# Patient Record
Sex: Male | Born: 1960 | Race: White | Hispanic: No | Marital: Single | Smoking: Never smoker
Health system: Southern US, Community
[De-identification: ages and names within clinical notes are randomized; demographics above are authoritative.]

## PROBLEM LIST (undated history)

## (undated) DIAGNOSIS — M2550 Pain in unspecified joint: Secondary | ICD-10-CM

## (undated) DIAGNOSIS — R5383 Other fatigue: Secondary | ICD-10-CM

## (undated) DIAGNOSIS — K4090 Unilateral inguinal hernia, without obstruction or gangrene, not specified as recurrent: Secondary | ICD-10-CM

## (undated) DIAGNOSIS — R5381 Other malaise: Secondary | ICD-10-CM

## (undated) DIAGNOSIS — M549 Dorsalgia, unspecified: Secondary | ICD-10-CM

## (undated) DIAGNOSIS — I10 Essential (primary) hypertension: Secondary | ICD-10-CM

## (undated) HISTORY — DX: Pain in unspecified joint: M25.50

## (undated) HISTORY — DX: Dorsalgia, unspecified: M54.9

## (undated) HISTORY — DX: Other fatigue: R53.83

## (undated) HISTORY — DX: Unilateral inguinal hernia, without obstruction or gangrene, not specified as recurrent: K40.90

## (undated) HISTORY — DX: Other malaise: R53.81

---

## 2014-02-04 ENCOUNTER — Ambulatory Visit (INDEPENDENT_AMBULATORY_CARE_PROVIDER_SITE_OTHER): Payer: Managed Care, Other (non HMO) | Admitting: General Surgery

## 2014-02-04 ENCOUNTER — Encounter (INDEPENDENT_AMBULATORY_CARE_PROVIDER_SITE_OTHER): Payer: Self-pay | Admitting: General Surgery

## 2014-02-04 VITALS — BP 128/80 | HR 71 | Temp 97.6°F | Resp 16 | Ht 70.0 in | Wt 204.2 lb

## 2014-02-04 DIAGNOSIS — K429 Umbilical hernia without obstruction or gangrene: Secondary | ICD-10-CM

## 2014-02-04 DIAGNOSIS — K4090 Unilateral inguinal hernia, without obstruction or gangrene, not specified as recurrent: Secondary | ICD-10-CM

## 2014-02-04 NOTE — Progress Notes (Signed)
Patient ID: Eddie ConstableJames D Sanderford, male   DOB: November 03, 1960, 53 y.o.   MRN: [id number]  No chief complaint on file.   HPI Eddie Frazier is a 53 y.o. male.  The patient is a 53 year old male is referred by Dr. Dossie ArbourW. Harris for evaluation of a right inguinal hernia as well as umbilical hernia. The patient comes today with a 10+ year history of an umbilical hernia which is soft reducible. He also states that approximately a month ago he was moving some boxes at work and felt a back pain as well as right inguinal numbness 2 days after her event. The patient states that over time this felt better however still has pain with sneezing, coughing and Valsalva. The patient comes in today for consultation for possible hernia repair in the future. HPI  Past Medical History  Diagnosis Date  . Inguinal hernia without mention of obstruction or gangrene, unilateral or unspecified, (not specified as recurrent)   . Pain in joint, site unspecified   . Backache, unspecified   . Other malaise and fatigue     No past surgical history on file.  Family History  Problem Relation Age of Onset  . Hypertension Mother   . Cancer Father     Throat Cancer    Social History History  Substance Use Topics  . Smoking status: Never Smoker   . Smokeless tobacco: Not on file  . Alcohol Use: Not on file    No Known Allergies  Current Outpatient Prescriptions  Medication Sig Dispense Refill  . cyclobenzaprine (FLEXERIL) 10 MG tablet       . meloxicam (MOBIC) 15 MG tablet        No current facility-administered medications for this visit.    Review of Systems Review of Systems  Constitutional: Negative.   HENT: Negative.   Eyes: Negative.   Respiratory: Negative.   Cardiovascular: Negative.   Gastrointestinal: Negative.   Endocrine: Negative.   Neurological: Negative.     Blood pressure 128/80, pulse 71, temperature 97.6 F (36.4 C), temperature source Temporal, resp. rate 16, height 5\' 10"  (1.778 m), weight  204 lb 3.2 oz (92.625 kg).  Physical Exam Physical Exam  Constitutional: He is oriented to person, place, and time. He appears well-developed and well-nourished.  HENT:  Head: Normocephalic and atraumatic.  Eyes: Conjunctivae and EOM are normal. Pupils are equal, round, and reactive to light.  Neck: Normal range of motion. Neck supple.  Cardiovascular: Normal rate, regular rhythm and normal heart sounds.   Pulmonary/Chest: Effort normal and breath sounds normal.  Abdominal: Bowel sounds are normal. A hernia is present. Hernia confirmed positive in the ventral area and confirmed positive in the right inguinal area (small).    Musculoskeletal: Normal range of motion.  Neurological: He is alert and oriented to person, place, and time.  Skin: Skin is warm and dry.    Data Reviewed none  Assessment    53 year old male with a likely small right inguinal hernia as well as a incarcerated umbilical hernia     Plan    1. The patient would like to think about proceeding with surgery at this time. I offered him laparoscopic right inguinal hernia repair for a likely small right inguinal hernia as well as an umbilical hernia repair via the same incision. 2. I discussed the inside symptoms of incarceration or strangulation and the need to proceed to the ER should these occur. 3. The patient will call us back decides upon surgery.  Marigene Ehlersamirez Jr., Armando 02/04/2014, 1:39 PM

## 2015-10-18 ENCOUNTER — Ambulatory Visit
Admission: RE | Admit: 2015-10-18 | Discharge: 2015-10-18 | Disposition: A | Discharge status: Home / Self Care | Payer: Managed Care, Other (non HMO) | Source: Ambulatory Visit | Attending: Family Medicine | Admitting: Family Medicine

## 2015-10-18 ENCOUNTER — Other Ambulatory Visit: Payer: Self-pay | Admitting: Family Medicine

## 2015-10-18 DIAGNOSIS — J40 Bronchitis, not specified as acute or chronic: Secondary | ICD-10-CM

## 2015-11-02 ENCOUNTER — Ambulatory Visit
Admission: RE | Admit: 2015-11-02 | Discharge: 2015-11-02 | Disposition: A | Discharge status: Home / Self Care | Payer: Managed Care, Other (non HMO) | Source: Ambulatory Visit | Attending: Family Medicine | Admitting: Family Medicine

## 2015-11-02 ENCOUNTER — Other Ambulatory Visit: Payer: Self-pay | Admitting: Family Medicine

## 2015-11-02 DIAGNOSIS — J181 Lobar pneumonia, unspecified organism: Secondary | ICD-10-CM

## 2016-09-16 ENCOUNTER — Encounter (HOSPITAL_BASED_OUTPATIENT_CLINIC_OR_DEPARTMENT_OTHER): Payer: Self-pay | Admitting: Emergency Medicine

## 2016-09-16 ENCOUNTER — Emergency Department (HOSPITAL_BASED_OUTPATIENT_CLINIC_OR_DEPARTMENT_OTHER): Payer: Worker's Compensation

## 2016-09-16 ENCOUNTER — Emergency Department (HOSPITAL_BASED_OUTPATIENT_CLINIC_OR_DEPARTMENT_OTHER)
Admission: EM | Admit: 2016-09-16 | Discharge: 2016-09-16 | Disposition: A | Discharge status: Home / Self Care | Payer: Worker's Compensation | Attending: Emergency Medicine | Admitting: Emergency Medicine

## 2016-09-16 DIAGNOSIS — W19XXXA Unspecified fall, initial encounter: Secondary | ICD-10-CM

## 2016-09-16 DIAGNOSIS — Y990 Civilian activity done for income or pay: Secondary | ICD-10-CM | POA: Insufficient documentation

## 2016-09-16 DIAGNOSIS — Y9289 Other specified places as the place of occurrence of the external cause: Secondary | ICD-10-CM | POA: Diagnosis not present

## 2016-09-16 DIAGNOSIS — Y9389 Activity, other specified: Secondary | ICD-10-CM | POA: Diagnosis not present

## 2016-09-16 DIAGNOSIS — M542 Cervicalgia: Secondary | ICD-10-CM

## 2016-09-16 DIAGNOSIS — W11XXXA Fall on and from ladder, initial encounter: Secondary | ICD-10-CM | POA: Diagnosis not present

## 2016-09-16 DIAGNOSIS — I10 Essential (primary) hypertension: Secondary | ICD-10-CM | POA: Diagnosis not present

## 2016-09-16 DIAGNOSIS — M546 Pain in thoracic spine: Secondary | ICD-10-CM | POA: Diagnosis not present

## 2016-09-16 DIAGNOSIS — S14109A Unspecified injury at unspecified level of cervical spinal cord, initial encounter: Secondary | ICD-10-CM | POA: Diagnosis present

## 2016-09-16 HISTORY — DX: Essential (primary) hypertension: I10

## 2016-09-16 MED ORDER — NAPROXEN 500 MG PO TABS: 500.0000 mg | ORAL_TABLET | Freq: Two times a day (BID) | ORAL | 0 refills | Status: AC

## 2016-09-16 MED ORDER — HYDROCODONE-ACETAMINOPHEN 5-325 MG PO TABS
2.0000 | ORAL_TABLET | Freq: Once | ORAL | Status: AC
  Administered 2016-09-16: 2 via ORAL
  Filled 2016-09-16: qty 2

## 2016-09-16 MED ORDER — METHOCARBAMOL 500 MG PO TABS: 500.0000 mg | ORAL_TABLET | Freq: Two times a day (BID) | ORAL | 0 refills | Status: AC

## 2016-09-16 NOTE — ED Provider Notes (Signed)
[facility] Provider Note CSN: [id number] Arrival date & time: 09/16/16  1242  History   Chief Complaint Chief Complaint  Patient presents with  . Back Pain    HPI Eddie Frazier is a 56 y.o. male with no pertinent past medical history presents reporting posterior neck, mid back and lower back pain status post accidental fall during work 1 hour prior to arrival. Patient states he was on a 6 ft ladder when he miscalculated a step and fell backwards and down 3 feet onto some tubing and then bounced off the tubing and fell onto the ground landing on his buttocks. Patient experienced immediate neck, mid back and lower back pain. No head trauma, no loss of consciousness, no nausea or vomiting. No previous back injuries or surgeries. No recent concussions.    HPI  Past Medical History:  Diagnosis Date  . Backache, unspecified   . Hypertension   . Inguinal hernia without mention of obstruction or gangrene, unilateral or unspecified, (not specified as recurrent)   . Other malaise and fatigue   . Pain in joint, site unspecified     There are no active problems to display for this patient.   History reviewed. No pertinent surgical history.     Home Medications    Prior to Admission medications   Medication Sig Start Date End Date Taking? Authorizing Provider  cyclobenzaprine (FLEXERIL) 10 MG tablet  01/23/14   Historical Provider, MD  meloxicam (MOBIC) 15 MG tablet  01/23/14   Historical Provider, MD  methocarbamol (ROBAXIN) 500 MG tablet Take 1 tablet (500 mg total) by mouth 2 (two) times daily. 09/16/16   Liberty Handy, PA-C  naproxen (NAPROSYN) 500 MG tablet Take 1 tablet (500 mg total) by mouth 2 (two) times daily. 09/16/16   Liberty Handy, PA-C    Family History Family History  Problem Relation Age of Onset  . Hypertension Mother   . Cancer Father     Throat Cancer    Social History Social History  Substance Use Topics  . Smoking status: Never Smoker    . Smokeless tobacco: Never Used  . Alcohol use Yes     Comment: socially     Allergies   Patient has no known allergies.   Review of Systems Review of Systems  Constitutional: Negative for chills and fever.  HENT: Negative for congestion and sore throat.   Eyes: Negative for visual disturbance.  Respiratory: Negative for cough and shortness of breath.   Cardiovascular: Negative for chest pain and palpitations.  Gastrointestinal: Negative for abdominal pain, blood in stool, constipation, diarrhea, nausea and vomiting.  Genitourinary: Negative for difficulty urinating, flank pain and hematuria.  Musculoskeletal: Positive for back pain and neck pain. Negative for joint swelling and myalgias.  Skin: Positive for wound. Negative for rash.  Neurological: Negative for dizziness, syncope, weakness, light-headedness and headaches.  Hematological: Negative.   Psychiatric/Behavioral: Negative.      Physical Exam Updated Vital Signs BP 150/92 (BP Location: Right Arm)   Pulse 92   Temp 98.1 F (36.7 C) (Oral)   Resp 16   Ht 5\' 11"  (1.803 m)   Wt 88.5 kg   SpO2 100%   BMI 27.20 kg/m   Physical Exam  Constitutional: He is oriented to person, place, and time. He appears well-developed and well-nourished. No distress.  NAD. Pt in c-collar.  HENT:  Head: Normocephalic and atraumatic.  Moist mucous membranes.  No nasal mucosa edema. Sinuses non tender. Oropharynx and  tonsils pink without erythema, edema, exudates or lesions.  Uvula midline. No trismus.  No facial bone or sinus tenderness.   Eyes: Conjunctivae and EOM are normal. Pupils are equal, round, and reactive to light.  Neck: Normal range of motion. Neck supple. No JVD present. No tracheal deviation present.  Cardiovascular: Normal rate, regular rhythm, normal heart sounds and intact distal pulses.   No murmur heard. Pulmonary/Chest: Effort normal and breath sounds normal. No respiratory distress. He has no wheezes. He has  no rales.  Abdominal: Soft. Bowel sounds are normal. He exhibits no distension. There is no tenderness.  Musculoskeletal: Normal range of motion. He exhibits no deformity.  Gait normal.  There is focal tenderness at C7, T8-T10.  C-collar put back on, waiting for CT scan.  Repeat exam after negative CT scan: Full active CTL spine ROM including flexion, extension, lateral bend and rotation.   C7 and T8-T10 midline tenderness. Mild tenderness over lumbar paraspinal mucles. SI joints and sciatic notch non tender.  Full passive hip, knee and ankle ROM bilaterally.  Negative SLR. Negative Faber. Negative Stinchfield test.   Lymphadenopathy:    He has no cervical adenopathy.  Neurological: He is alert and oriented to person, place, and time.  Pt is alert and oriented.  Speech and phonation normal.  Thought process coherent.  Strength 5/5 in upper and lower extremities.   Sensation to light touch intact in upper and lower extremities.  Gait normal.   Negative Romberg. No leg drift.  Intact finger to nose test. CN I not tested CN II full visual fields  CN III, IV, VI PEERL and EOM intact CN V light touch intact in all 3 divisions of trigeminal nerve CN VII facial nerve movements intact, symmetric CN VIII hearing intact to finger rub CN IX, X no uvula deviation, symmetric soft palate rise CN XI 5/5 SCM and trapezius strength  CN XII Tongue midline with symmetric L/R movement  Skin: Skin is warm and dry. Capillary refill takes less than 2 seconds.  Abrasions to posterior L flank without surrounding edema, ecchymosis or tenderness. No active bleeding.  Psychiatric: He has a normal mood and affect. His behavior is normal. Judgment and thought content normal.  Nursing note and vitals reviewed.    ED Treatments / Results  Labs (all labs ordered are listed, but only abnormal results are displayed) Labs Reviewed - No data to display  EKG  EKG Interpretation None       Radiology Ct  Cervical Spine Wo Contrast  Result Date: 09/16/2016 CLINICAL DATA:  Larey SeatFell off ladder EXAM: CT CERVICAL SPINE WITHOUT CONTRAST TECHNIQUE: Multidetector CT imaging of the cervical spine was performed without intravenous contrast. Multiplanar CT image reconstructions were also generated. COMPARISON:  None. FINDINGS: Alignment: Normal Skull base and vertebrae: No acute fracture. No primary bone lesion or focal pathologic process.Well-circumscribed bone densities are adjacent to the C5 and T1 spinous processes, likely related to old injury. Soft tissues and spinal canal: No prevertebral fluid or swelling. No visible canal hematoma. Disc levels:  Spurring at C5-6 and C6-7 anteriorly. Upper chest: Negative Other: None IMPRESSION: No acute bony abnormality. Electronically Signed   By: Charlett NoseKevin  Dover M.D.   On: 09/16/2016 15:19   Ct Thoracic Spine Wo Contrast  Result Date: 09/16/2016 CLINICAL DATA:  Fall 6 feet from ladder.  Mid back pain. EXAM: CT THORACIC AND LUMBAR SPINE WITHOUT CONTRAST TECHNIQUE: Multidetector CT imaging of the thoracic and lumbar spine was performed without contrast. Multiplanar CT image  reconstructions were also generated. COMPARISON:  None. FINDINGS: CT THORACIC SPINE FINDINGS Alignment: Normal Vertebrae: No acute fracture or focal pathologic process. Paraspinal and other soft tissues: Negative Disc levels: Mild spurring in the lower thoracic spine. CT LUMBAR SPINE FINDINGS Segmentation: 5 lumbar type vertebrae. Alignment: Normal. Vertebrae: No acute fracture or focal pathologic process. Paraspinal and other soft tissues: Negative. Disc levels: Spurring throughout the lumbar spine anteriorly. IMPRESSION: CT THORACIC SPINE IMPRESSION No acute bony abnormality. CT LUMBAR SPINE IMPRESSION No acute bony abnormality. Electronically Signed   By: Charlett Nose M.D.   On: 09/16/2016 15:24   Ct Lumbar Spine Wo Contrast  Result Date: 09/16/2016 CLINICAL DATA:  Fall 6 feet from ladder.  Mid back pain.  EXAM: CT THORACIC AND LUMBAR SPINE WITHOUT CONTRAST TECHNIQUE: Multidetector CT imaging of the thoracic and lumbar spine was performed without contrast. Multiplanar CT image reconstructions were also generated. COMPARISON:  None. FINDINGS: CT THORACIC SPINE FINDINGS Alignment: Normal Vertebrae: No acute fracture or focal pathologic process. Paraspinal and other soft tissues: Negative Disc levels: Mild spurring in the lower thoracic spine. CT LUMBAR SPINE FINDINGS Segmentation: 5 lumbar type vertebrae. Alignment: Normal. Vertebrae: No acute fracture or focal pathologic process. Paraspinal and other soft tissues: Negative. Disc levels: Spurring throughout the lumbar spine anteriorly. IMPRESSION: CT THORACIC SPINE IMPRESSION No acute bony abnormality. CT LUMBAR SPINE IMPRESSION No acute bony abnormality. Electronically Signed   By: Charlett Nose M.D.   On: 09/16/2016 15:24    Procedures Procedures (including critical care time)  Medications Ordered in ED Medications  HYDROcodone-acetaminophen (NORCO/VICODIN) 5-325 MG per tablet 2 tablet (2 tablets Oral Given 09/16/16 1536)     Initial Impression / Assessment and Plan / ED Course  I have reviewed the triage vital signs and the nursing notes.  Pertinent labs & imaging results that were available during my care of the patient were reviewed by me and considered in my medical decision making (see chart for details).  Clinical Course as of Sep 16 1640  Sat Sep 16, 2016  1612 Negative CTL CT scans for acute osseous injury CT Thoracic Spine Wo Contrast [CG]    Clinical Course User Index [CG] Liberty Handy, PA-C    56 year old with no pertinent past medical history reports to ED after work incident. Patient fell off 6 foot ladder onto to tubing and then onto the ground landing directly on his buttocks. Patient reported C7 and T8-T10 midline tenderness.  Pt not on anticoagulants.  CTL CT scans negative for acute osseous injuries. Patient has full range  of motion of the CTL spine with appropriate muscular tenderness mostly at posterior neck and mid back. I personally ambulated the patient in ED without difficulty. No concerned for intracranial emergencies, there was no head trauma, no LOC.  Neurological exam completely normal.  Patient will be discharged with Robaxin and naproxen, back exercises, follow-up with PCP as needed.  Final Clinical Impressions(s) / ED Diagnoses   Final diagnoses:  Fall, initial encounter  Cervical spine pain  Thoracic spine pain  Work place accident    New Prescriptions Discharge Medication List as of 09/16/2016  4:35 PM    START taking these medications   Details  methocarbamol (ROBAXIN) 500 MG tablet Take 1 tablet (500 mg total) by mouth 2 (two) times daily., Starting Sat 09/16/2016, Print    naproxen (NAPROSYN) 500 MG tablet Take 1 tablet (500 mg total) by mouth 2 (two) times daily., Starting Sat 09/16/2016, Print  Liberty Handy, PA-C 09/16/16 1642    Marily Memos, MD 09/17/16 [phone]

## 2016-09-16 NOTE — ED Notes (Signed)
Patient transported to CT 

## 2016-09-16 NOTE — Discharge Instructions (Signed)
Your scans were negative today for any acute bony injuries from your fall. We will treat your symptoms conservatively with muscle relaxers, anti-inflammatory and anti-pain medications.  Please take these medications as prescribed. Make sure you rest for the next 2 days. After 2 days you may resume mild activity, mild stretches, heating. Please follow up with your primary care provider if  you continue to experience back pain from this fall as you may need physical therapy.

## 2016-09-16 NOTE — ED Triage Notes (Signed)
Larey SeatFell off a ladder about 616ft off ground and landed on buttocks, while working about 1 hr ago. No LOC, neck and mid back pain. Hard collar applied in triage

## 2018-02-01 IMAGING — CR DG CHEST 2V
2 series · 2 of 2 positions shown · non-contrast
Comparison: Chest x-ray of 10/18/2015

CLINICAL DATA: Recent history of pneumonia, followup

EXAM:
CHEST  2 VIEW

[w chest pa]
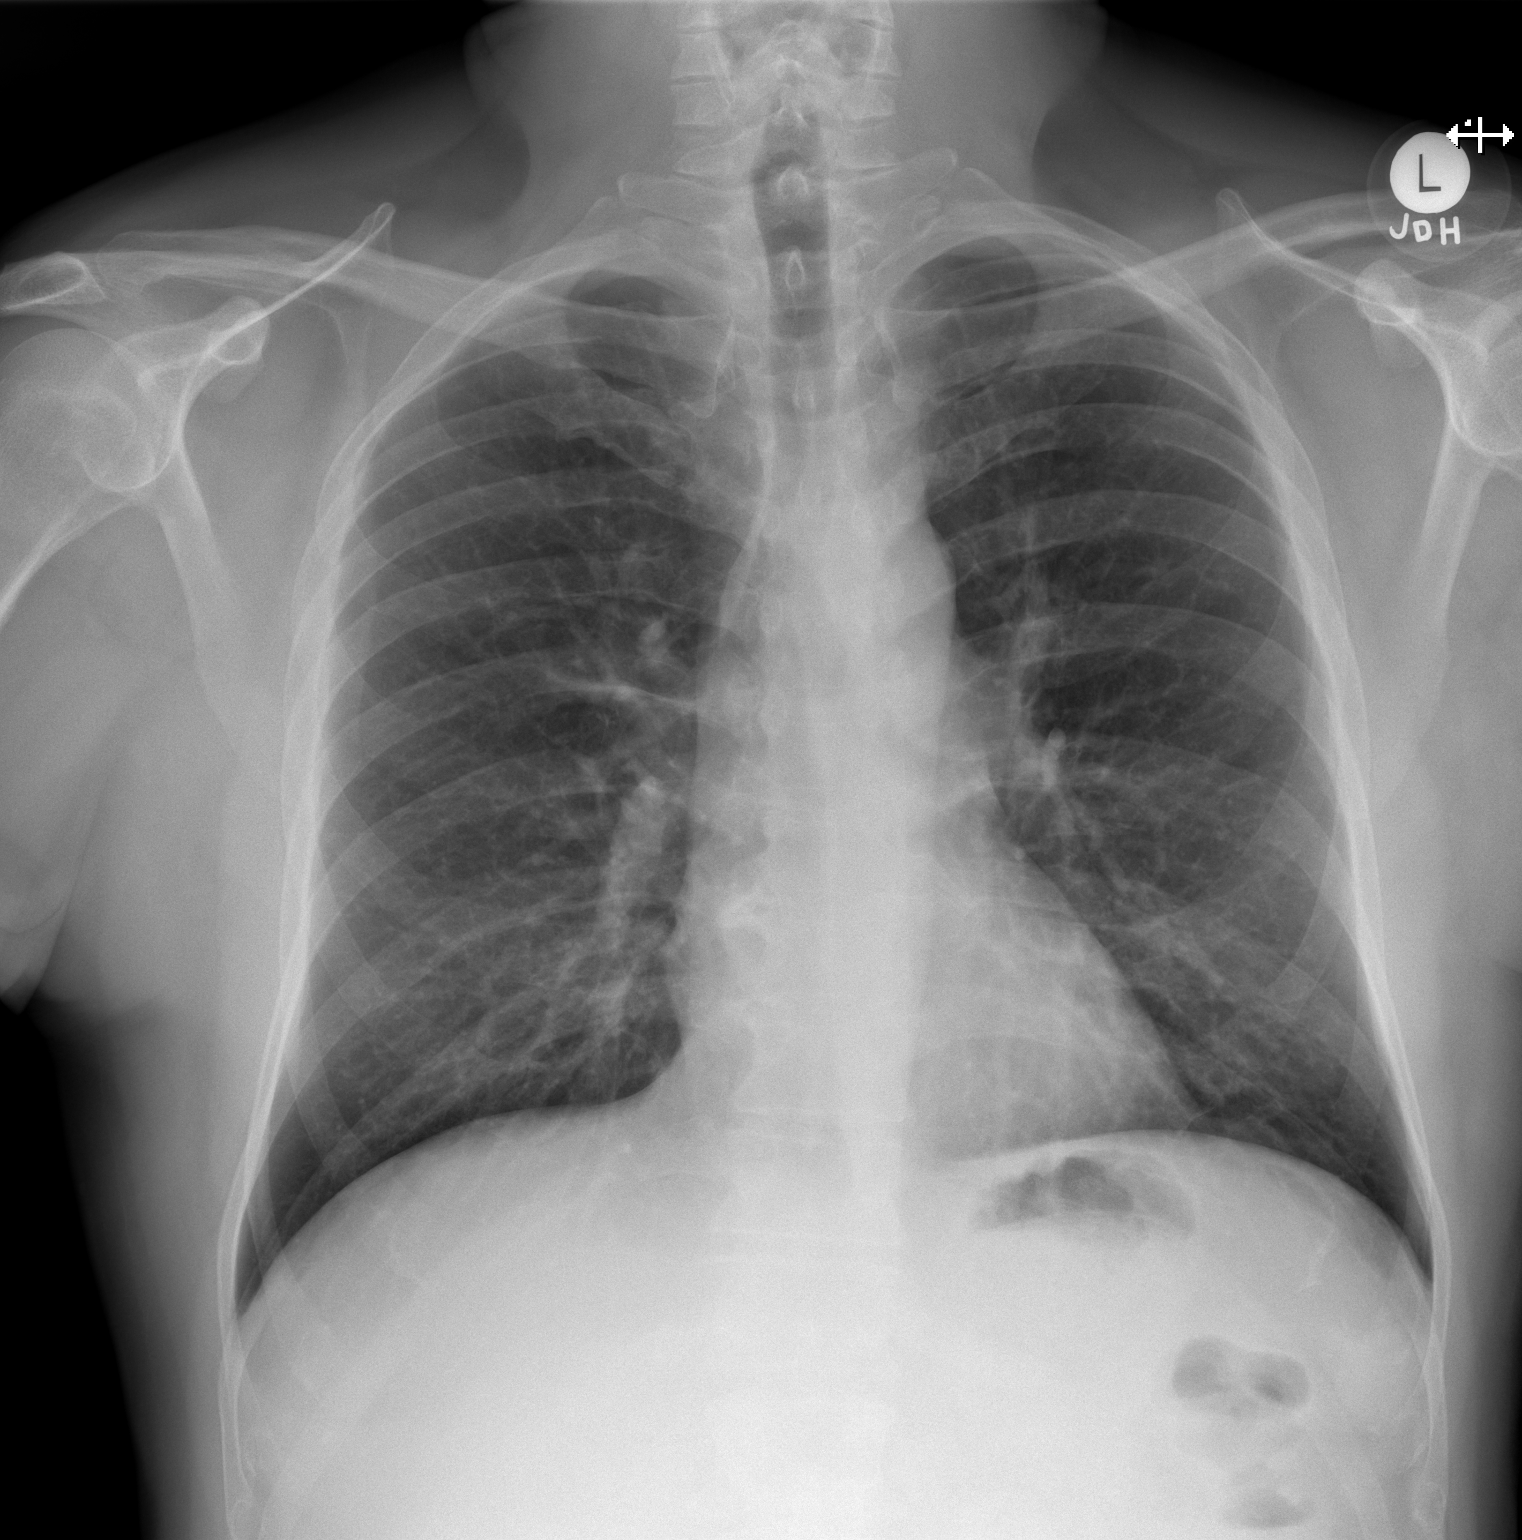

[w chest lat]
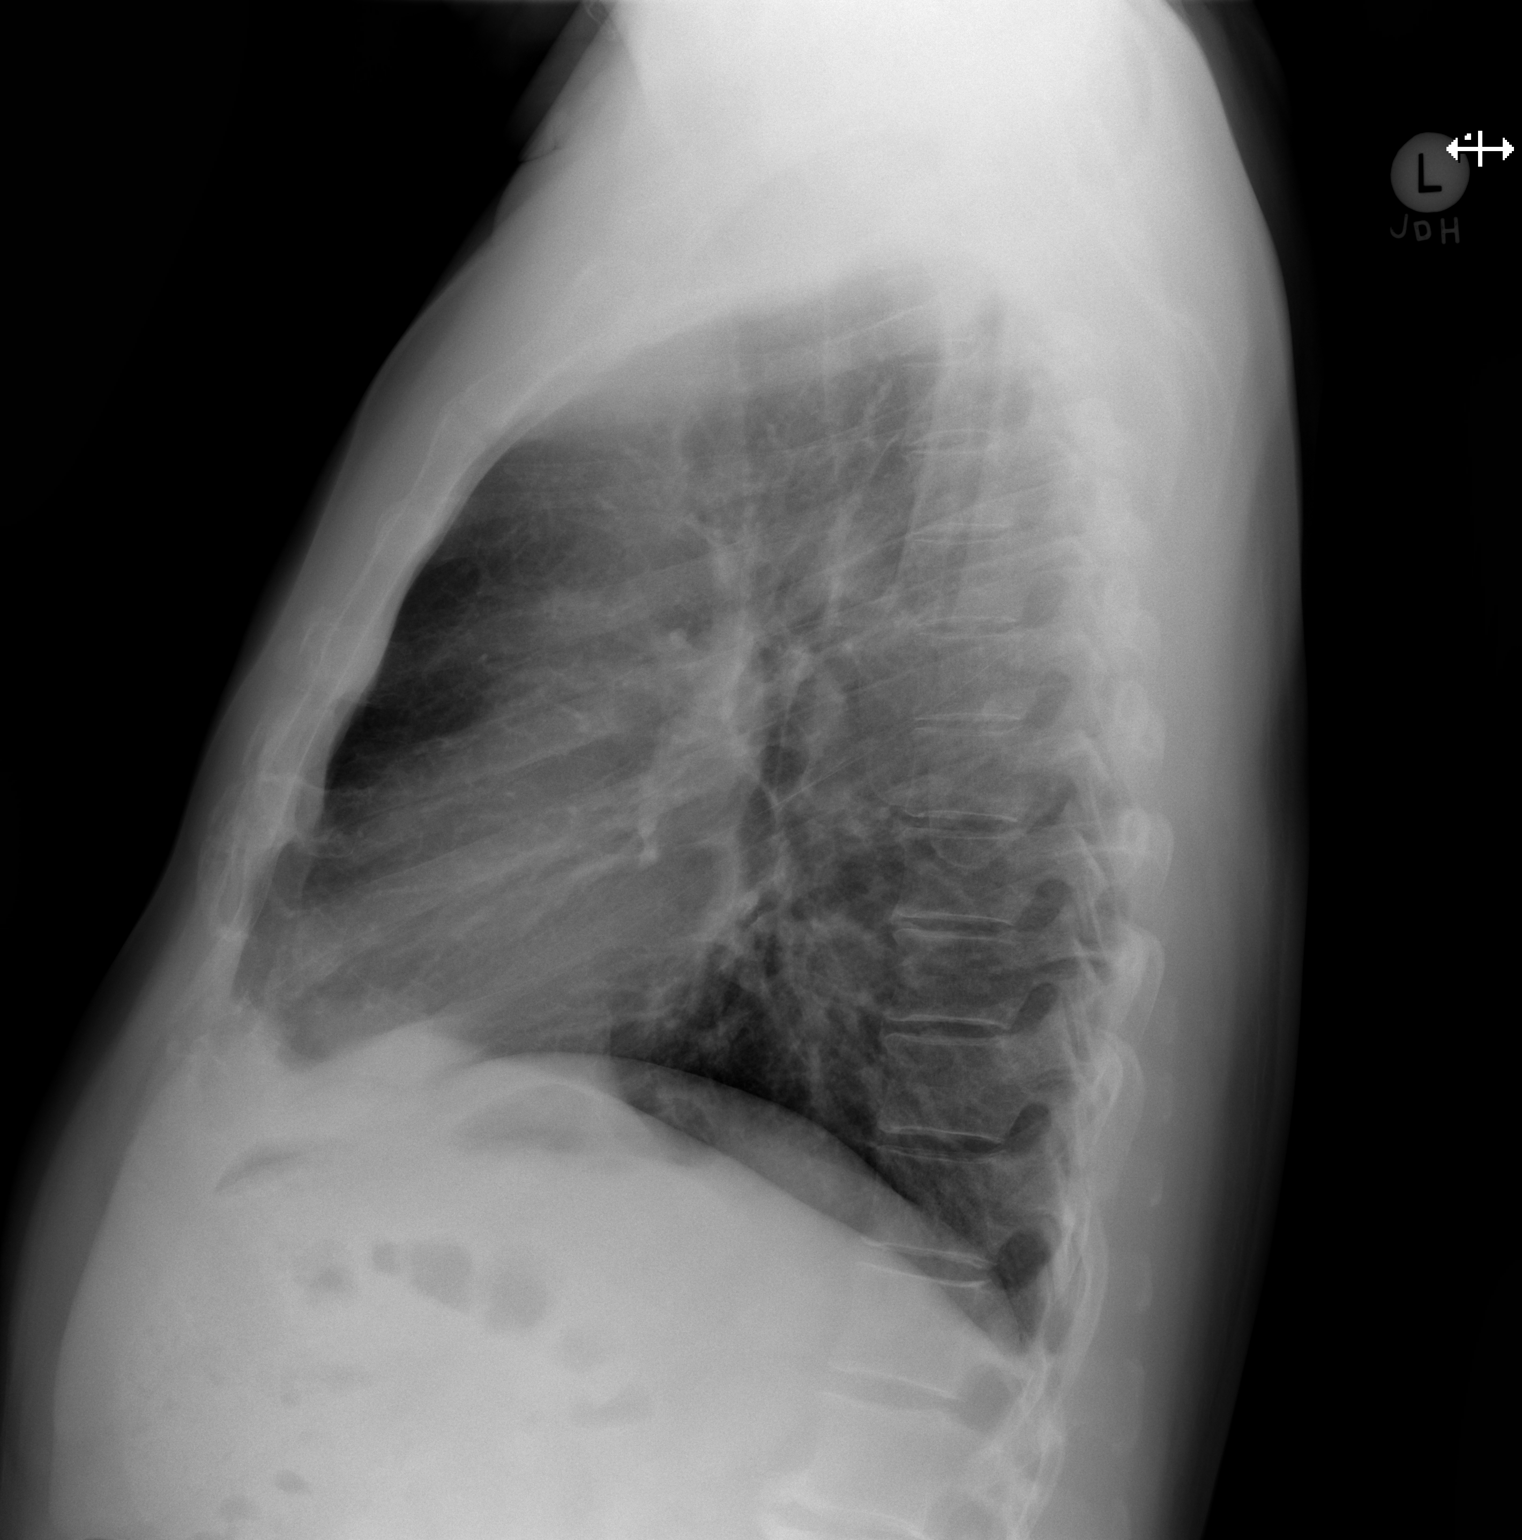

[2 of 2 positions shown; findings below may reference images not displayed]

FINDINGS: No definite pneumonia is currently seen. Mediastinal and hilar
contours are unremarkable. The heart is within normal limits in
size. No bony abnormality is noted.
IMPRESSION: No active cardiopulmonary disease.

## 2018-10-11 DIAGNOSIS — I10 Essential (primary) hypertension: Secondary | ICD-10-CM | POA: Diagnosis not present

## 2018-11-16 DIAGNOSIS — M1A9XX0 Chronic gout, unspecified, without tophus (tophi): Secondary | ICD-10-CM | POA: Diagnosis not present

## 2018-11-18 DIAGNOSIS — M109 Gout, unspecified: Secondary | ICD-10-CM | POA: Diagnosis not present

## 2018-11-20 DIAGNOSIS — J0121 Acute recurrent ethmoidal sinusitis: Secondary | ICD-10-CM | POA: Diagnosis not present

## 2018-11-27 DIAGNOSIS — R21 Rash and other nonspecific skin eruption: Secondary | ICD-10-CM | POA: Diagnosis not present

## 2018-12-17 IMAGING — CT CT T SPINE W/O CM
3 of 8 series · 14 of 33 positions shown, 15 images · non-contrast
Comparison: None.

CLINICAL DATA: Fall 6 feet from ladder.  Mid back pain.

EXAM:
CT THORACIC AND LUMBAR SPINE WITHOUT CONTRAST
TECHNIQUE: Multidetector CT imaging of the thoracic and lumbar spine was
performed without contrast. Multiplanar CT image reconstructions
were also generated.

[Series 3: c spine soft · axial · 0.46mm/px · z∈[-709,-181]mm · 8 of 340 slices shown]
[im 38/340  soft-tissue]
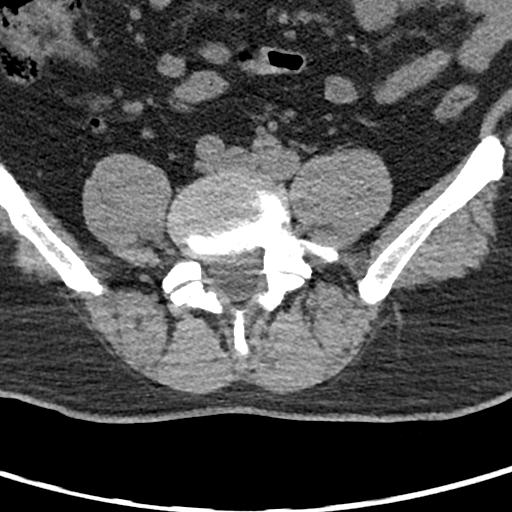
[im 76/340  soft-tissue]
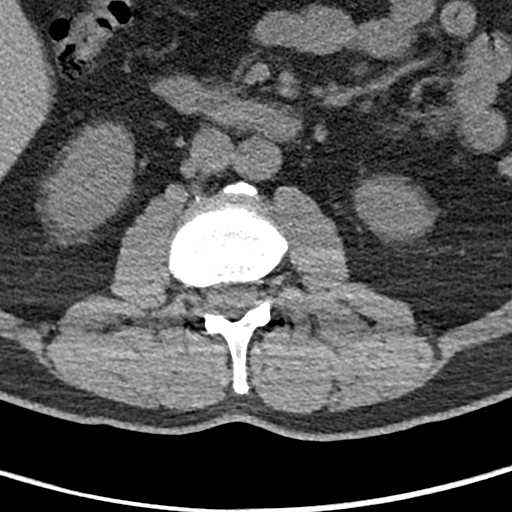
[im 114/340  soft-tissue]
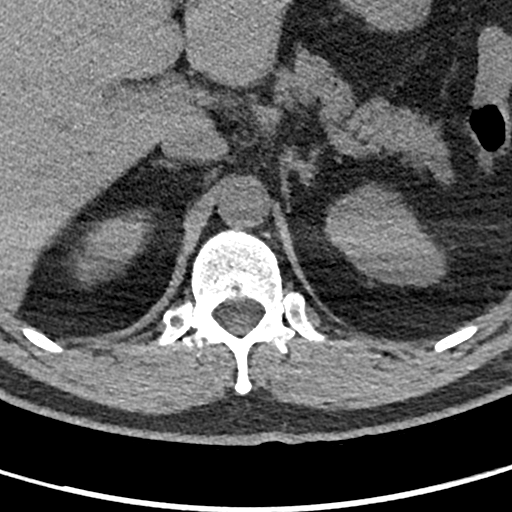
[im 151/340  soft-tissue]
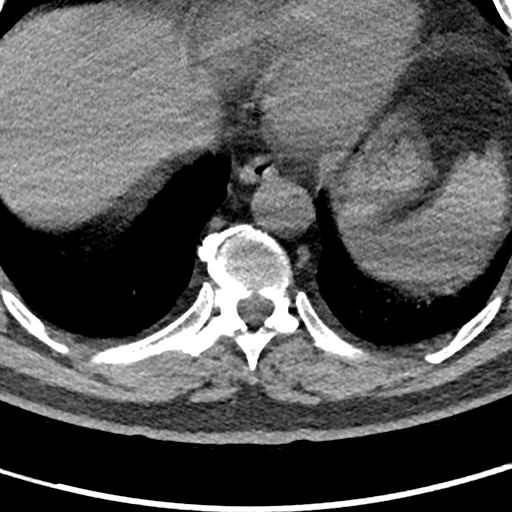
[im 189/340  soft-tissue]
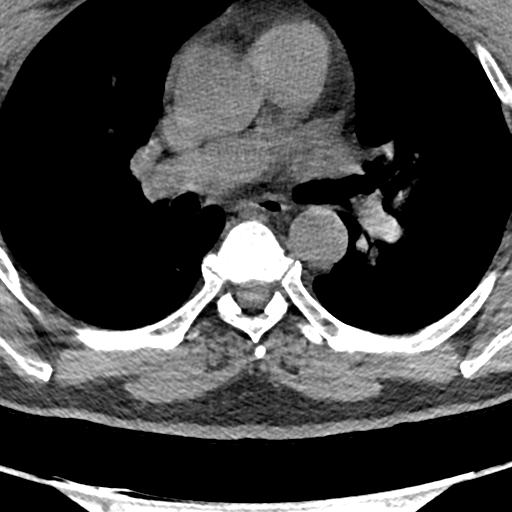
[im 227/340  soft-tissue]
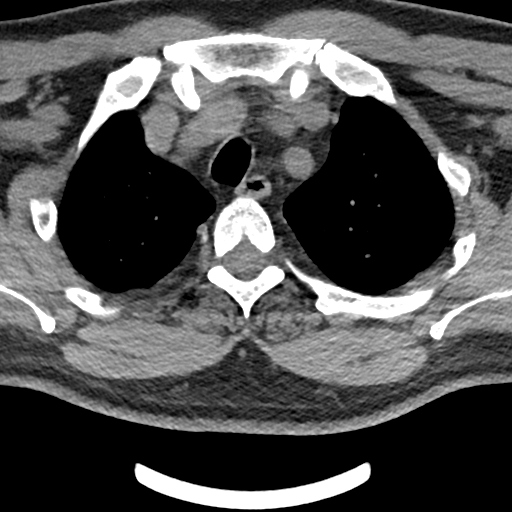
[im 264/340  soft-tissue]
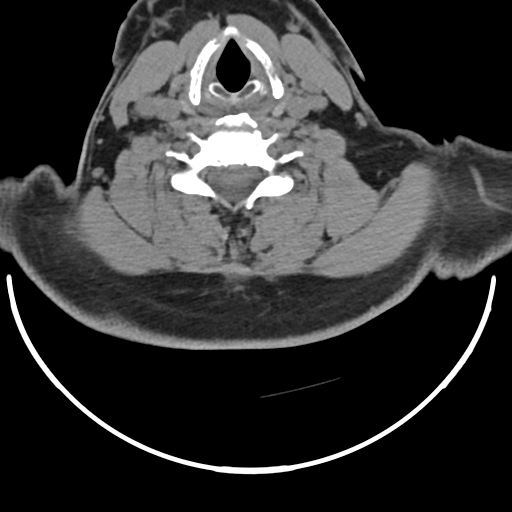
[im 302/340  soft-tissue]
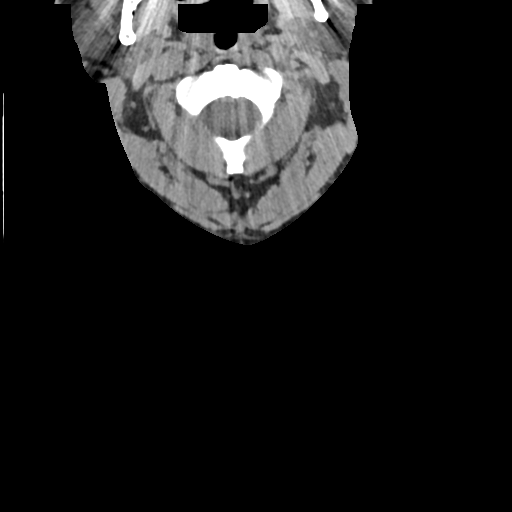

[Series 7: orthogonal bone · axial · 0.26mm/px · z∈[-291,-227]mm · 2 of 114 slices shown, 3 images]
[im 38/114  soft-tissue]
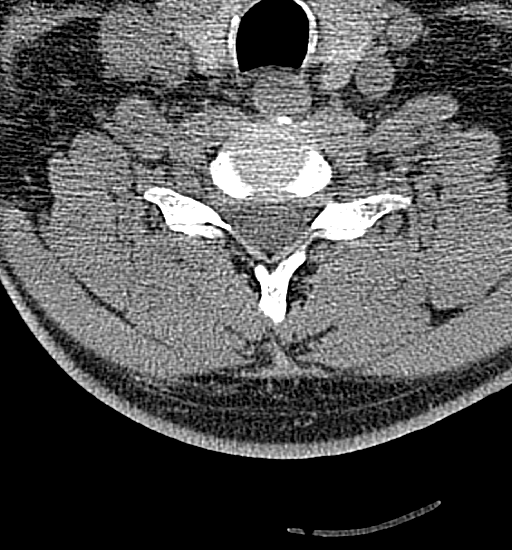
[im 38/114  bone]
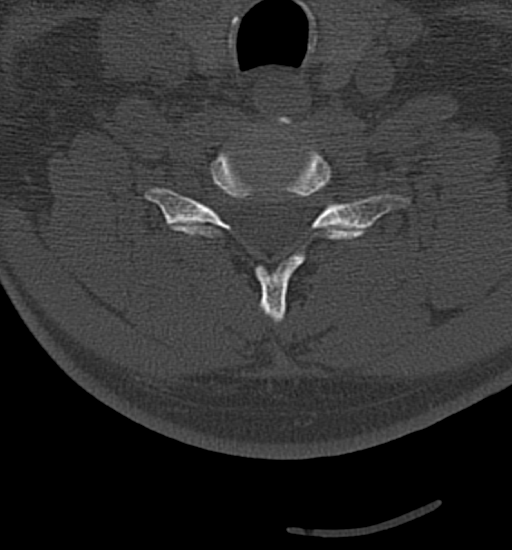
[im 76/114  bone]
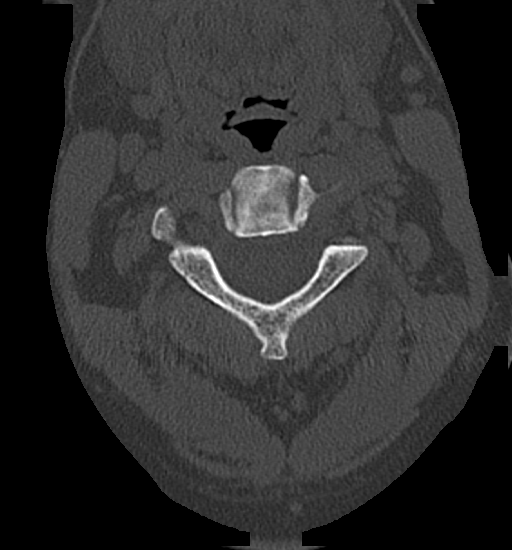

[Series 10: sagittal bone · sagittal · 0.39mm/px · 4 of 103 slices shown]
[im 21/103  bone]
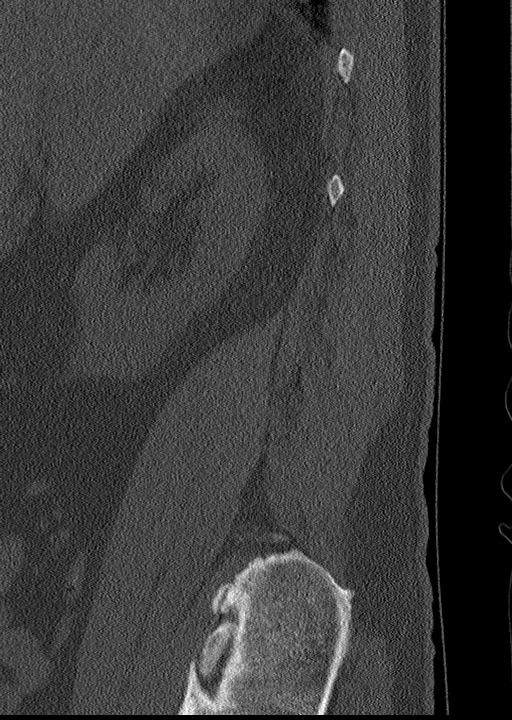
[im 41/103  bone]
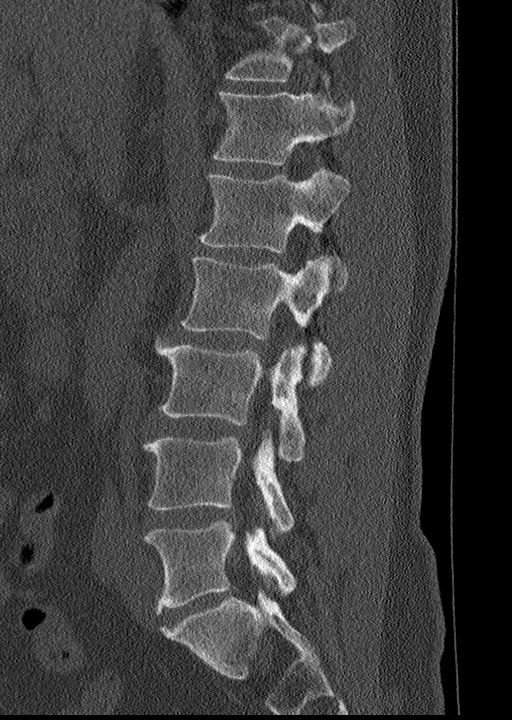
[im 62/103  bone]
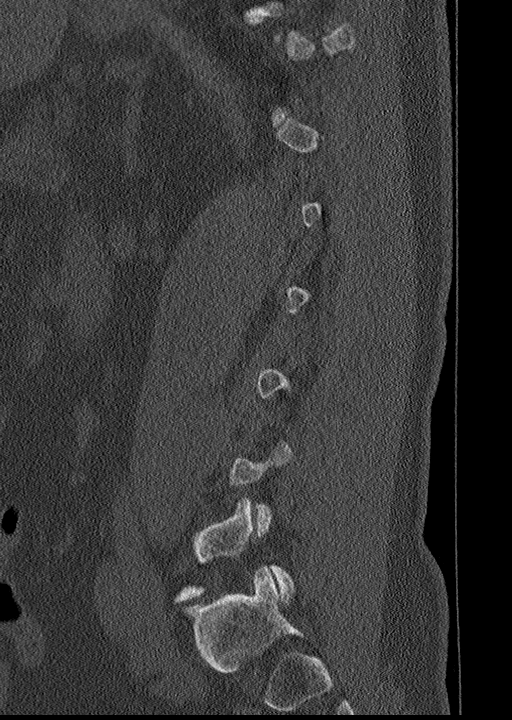
[im 82/103  bone]
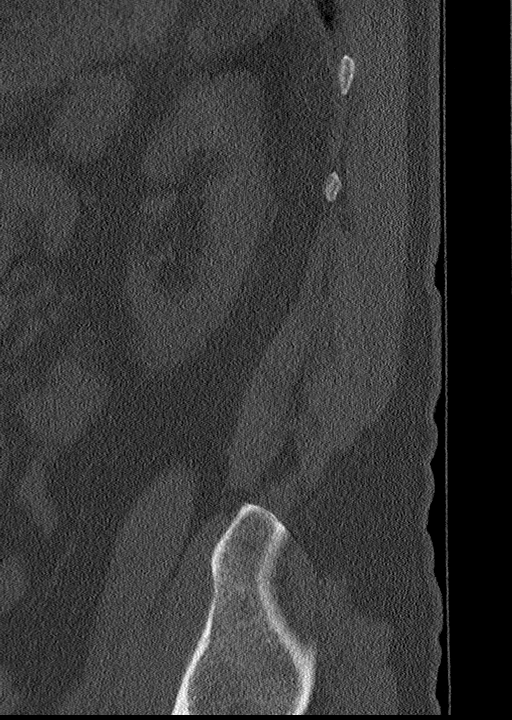

[14 of 33 positions shown; findings below may reference images not displayed]

FINDINGS: CT THORACIC SPINE FINDINGS

Alignment: Normal

Vertebrae: No acute fracture or focal pathologic process.

Paraspinal and other soft tissues: Negative

Disc levels: Mild spurring in the lower thoracic spine.

CT LUMBAR SPINE FINDINGS

Segmentation: 5 lumbar type vertebrae.

Alignment: Normal.

Vertebrae: No acute fracture or focal pathologic process.

Paraspinal and other soft tissues: Negative.

Disc levels: Spurring throughout the lumbar spine anteriorly.
IMPRESSION: CT THORACIC SPINE IMPRESSION

No acute bony abnormality.

CT LUMBAR SPINE IMPRESSION

No acute bony abnormality.
